# Patient Record
Sex: Male | Born: 1990 | Race: White | Hispanic: No | Marital: Single | Smoking: Current every day smoker
Health system: Southern US, Community
[De-identification: ages and names within clinical notes are randomized; demographics above are authoritative.]

---
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
## 2012-05-20 ENCOUNTER — Emergency Department (HOSPITAL_COMMUNITY): Payer: BC Managed Care – PPO

## 2012-05-20 ENCOUNTER — Encounter (HOSPITAL_COMMUNITY): Payer: Self-pay | Admitting: Anesthesiology

## 2012-05-20 ENCOUNTER — Observation Stay (HOSPITAL_COMMUNITY)
Admission: EM | Admit: 2012-05-20 | Discharge: 2012-05-20 | Disposition: A | Discharge status: Home / Self Care | Payer: BC Managed Care – PPO | Attending: Emergency Medicine | Admitting: Emergency Medicine

## 2012-05-20 ENCOUNTER — Encounter (HOSPITAL_COMMUNITY): Payer: Self-pay | Admitting: Emergency Medicine

## 2012-05-20 ENCOUNTER — Encounter (HOSPITAL_COMMUNITY)
Admission: EM | Disposition: A | Discharge status: Home / Self Care | Payer: Self-pay | Source: Home / Self Care | Attending: Emergency Medicine

## 2012-05-20 ENCOUNTER — Observation Stay (HOSPITAL_COMMUNITY): Payer: BC Managed Care – PPO | Admitting: Anesthesiology

## 2012-05-20 DIAGNOSIS — S02600B Fracture of unspecified part of body of mandible, initial encounter for open fracture: Secondary | ICD-10-CM

## 2012-05-20 DIAGNOSIS — S0266XA Fracture of symphysis of mandible, initial encounter for closed fracture: Secondary | ICD-10-CM | POA: Insufficient documentation

## 2012-05-20 DIAGNOSIS — Y92009 Unspecified place in unspecified non-institutional (private) residence as the place of occurrence of the external cause: Secondary | ICD-10-CM | POA: Insufficient documentation

## 2012-05-20 DIAGNOSIS — S0300XA Dislocation of jaw, unspecified side, initial encounter: Secondary | ICD-10-CM | POA: Insufficient documentation

## 2012-05-20 DIAGNOSIS — S025XXA Fracture of tooth (traumatic), initial encounter for closed fracture: Secondary | ICD-10-CM | POA: Insufficient documentation

## 2012-05-20 DIAGNOSIS — W1809XA Striking against other object with subsequent fall, initial encounter: Secondary | ICD-10-CM | POA: Insufficient documentation

## 2012-05-20 DIAGNOSIS — S02640A Fracture of ramus of mandible, unspecified side, initial encounter for closed fracture: Principal | ICD-10-CM | POA: Insufficient documentation

## 2012-05-20 DIAGNOSIS — R55 Syncope and collapse: Secondary | ICD-10-CM | POA: Insufficient documentation

## 2012-05-20 HISTORY — PX: CLOSED REDUCTION MANDIBLE: SHX5307

## 2012-05-20 LAB — PROTIME-INR: INR: 1.07 (ref 0.00–1.49)

## 2012-05-20 LAB — CBC WITH DIFFERENTIAL/PLATELET
Eosinophils Relative: 3 % (ref 0–5)
HCT: 44.6 % (ref 39.0–52.0)
Lymphocytes Relative: 27 % (ref 12–46)
Lymphs Abs: 2.5 10*3/uL (ref 0.7–4.0)
MCV: 88.3 fL (ref 78.0–100.0)
Monocytes Absolute: 0.7 10*3/uL (ref 0.1–1.0)
Neutro Abs: 5.8 10*3/uL (ref 1.7–7.7)
Platelets: 205 10*3/uL (ref 150–400)
RBC: 5.05 MIL/uL (ref 4.22–5.81)
WBC: 9.2 10*3/uL (ref 4.0–10.5)

## 2012-05-20 LAB — BASIC METABOLIC PANEL
CO2: 30 mEq/L (ref 19–32)
Calcium: 9.1 mg/dL (ref 8.4–10.5)
Chloride: 105 mEq/L (ref 96–112)
Glucose, Bld: 130 mg/dL — ABNORMAL HIGH (ref 70–99)
Sodium: 143 mEq/L (ref 135–145)

## 2012-05-20 SURGERY — CLOSED REDUCTION, MANDIBLE
Anesthesia: General | Wound class: Clean

## 2012-05-20 SURGERY — CANCELLED PROCEDURE

## 2012-05-20 MED ORDER — DEXAMETHASONE SODIUM PHOSPHATE 4 MG/ML IJ SOLN
INTRAMUSCULAR | Status: DC | PRN
  Administered 2012-05-20: 8 mg via INTRAVENOUS

## 2012-05-20 MED ORDER — LACTATED RINGERS IV SOLN
INTRAVENOUS | Status: DC | PRN
  Administered 2012-05-20 (×2): via INTRAVENOUS

## 2012-05-20 MED ORDER — MORPHINE SULFATE 4 MG/ML IJ SOLN: 4.0000 mg | Freq: Once | INTRAMUSCULAR | Status: DC

## 2012-05-20 MED ORDER — HYDROMORPHONE HCL PF 1 MG/ML IJ SOLN
INTRAMUSCULAR | Status: AC
  Filled 2012-05-20: qty 1

## 2012-05-20 MED ORDER — ONDANSETRON HCL 4 MG/2ML IJ SOLN
INTRAMUSCULAR | Status: DC | PRN
  Administered 2012-05-20: 4 mg via INTRAVENOUS

## 2012-05-20 MED ORDER — CEFAZOLIN SODIUM-DEXTROSE 2-3 GM-% IV SOLR
INTRAVENOUS | Status: DC | PRN
  Administered 2012-05-20: 2 g via INTRAVENOUS

## 2012-05-20 MED ORDER — SODIUM CHLORIDE 0.9 % IV BOLUS (SEPSIS)
1000.0000 mL | Freq: Once | INTRAVENOUS | Status: AC
  Administered 2012-05-20: 1000 mL via INTRAVENOUS

## 2012-05-20 MED ORDER — HYDROMORPHONE HCL PF 1 MG/ML IJ SOLN: 0.2500 mg | INTRAMUSCULAR | Status: DC | PRN

## 2012-05-20 MED ORDER — HYDROMORPHONE HCL PF 1 MG/ML IJ SOLN
0.2500 mg | INTRAMUSCULAR | Status: DC | PRN
  Administered 2012-05-20 (×4): 0.5 mg via INTRAVENOUS

## 2012-05-20 MED ORDER — HYDROMORPHONE HCL PF 1 MG/ML IJ SOLN: 1.0000 mg | INTRAMUSCULAR | Status: DC | PRN

## 2012-05-20 MED ORDER — HYDROCODONE-ACETAMINOPHEN 7.5-500 MG/15ML PO SOLN: 10.0000 mL | Freq: Four times a day (QID) | ORAL | Status: AC | PRN

## 2012-05-20 MED ORDER — SUCCINYLCHOLINE CHLORIDE 20 MG/ML IJ SOLN
INTRAMUSCULAR | Status: DC | PRN
  Administered 2012-05-20: 100 mg via INTRAVENOUS

## 2012-05-20 MED ORDER — HYDROMORPHONE HCL PF 1 MG/ML IJ SOLN
1.0000 mg | Freq: Once | INTRAMUSCULAR | Status: AC
  Administered 2012-05-20: 1 mg via INTRAVENOUS
  Filled 2012-05-20: qty 1

## 2012-05-20 MED ORDER — ONDANSETRON HCL 4 MG/2ML IJ SOLN
4.0000 mg | Freq: Once | INTRAMUSCULAR | Status: AC
  Administered 2012-05-20: 4 mg via INTRAVENOUS
  Filled 2012-05-20: qty 2

## 2012-05-20 MED ORDER — LACTATED RINGERS IV SOLN: INTRAVENOUS | Status: DC | PRN

## 2012-05-20 MED ORDER — MORPHINE SULFATE 4 MG/ML IJ SOLN
4.0000 mg | Freq: Once | INTRAMUSCULAR | Status: AC
  Administered 2012-05-20: 4 mg via INTRAVENOUS
  Filled 2012-05-20: qty 1

## 2012-05-20 MED ORDER — LIDOCAINE HCL (CARDIAC) 20 MG/ML IV SOLN
INTRAVENOUS | Status: DC | PRN
  Administered 2012-05-20: 100 mg via INTRAVENOUS

## 2012-05-20 MED ORDER — OXYMETAZOLINE HCL 0.05 % NA SOLN
NASAL | Status: DC | PRN
  Administered 2012-05-20: 2 via NASAL

## 2012-05-20 MED ORDER — SODIUM CHLORIDE 0.9 % IV SOLN
INTRAVENOUS | Status: DC | PRN
  Administered 2012-05-20: 14:00:00 via INTRAVENOUS

## 2012-05-20 MED ORDER — BACIT-POLY-NEO HC 1 % EX OINT
TOPICAL_OINTMENT | CUTANEOUS | Status: DC | PRN
  Administered 2012-05-20: 1

## 2012-05-20 MED ORDER — FENTANYL CITRATE 0.05 MG/ML IJ SOLN
INTRAMUSCULAR | Status: DC | PRN
  Administered 2012-05-20 (×2): 75 ug via INTRAVENOUS
  Administered 2012-05-20: 50 ug via INTRAVENOUS
  Administered 2012-05-20: 100 ug via INTRAVENOUS

## 2012-05-20 MED ORDER — SODIUM CHLORIDE 0.9 % IV SOLN
3.0000 g | Freq: Once | INTRAVENOUS | Status: AC
  Administered 2012-05-20: 3 g via INTRAVENOUS
  Filled 2012-05-20: qty 3

## 2012-05-20 MED ORDER — PROPOFOL 10 MG/ML IV BOLUS
INTRAVENOUS | Status: DC | PRN
  Administered 2012-05-20: 50 mg via INTRAVENOUS
  Administered 2012-05-20: 150 mg via INTRAVENOUS

## 2012-05-20 SURGICAL SUPPLY — 56 items
ATTRACTOMAT 16X20 MAGNETIC DRP (DRAPES) IMPLANT
BLADE SURG 15 STRL LF DISP TIS (BLADE) IMPLANT
BLADE SURG 15 STRL SS (BLADE)
BUR FAST CUTTING (BURR)
BUR SRGRND 54.5X2.4X8 (BURR) IMPLANT
BURR SRGRND 54.5X2.4X8 (BURR)
CANISTER SUCTION 2500CC (MISCELLANEOUS) ×3 IMPLANT
CLEANER TIP ELECTROSURG 2X2 (MISCELLANEOUS) ×3 IMPLANT
CLOTH BEACON ORANGE TIMEOUT ST (SAFETY) ×3 IMPLANT
COVER SURGICAL LIGHT HANDLE (MISCELLANEOUS) ×3 IMPLANT
DECANTER SPIKE VIAL GLASS SM (MISCELLANEOUS) ×3 IMPLANT
DRESSING TELFA 8X10 (GAUZE/BANDAGES/DRESSINGS) ×3 IMPLANT
ELECT REM PT RETURN 9FT ADLT (ELECTROSURGICAL) ×3
ELECTRODE REM PT RTRN 9FT ADLT (ELECTROSURGICAL) ×2 IMPLANT
GAUZE PACKING FOLDED 2  STR (GAUZE/BANDAGES/DRESSINGS) ×1
GAUZE PACKING FOLDED 2 STR (GAUZE/BANDAGES/DRESSINGS) ×2 IMPLANT
GAUZE SPONGE 4X4 16PLY XRAY LF (GAUZE/BANDAGES/DRESSINGS) ×3 IMPLANT
GLOVE BIO SURGEON STRL SZ 6.5 (GLOVE) IMPLANT
GLOVE BIO SURGEON STRL SZ7.5 (GLOVE) IMPLANT
GLOVE BIOGEL PI IND STRL 8 (GLOVE) ×2 IMPLANT
GLOVE BIOGEL PI INDICATOR 8 (GLOVE) ×1
GLOVE ECLIPSE 7.5 STRL STRAW (GLOVE) ×6 IMPLANT
GOWN STRL NON-REIN LRG LVL3 (GOWN DISPOSABLE) ×6 IMPLANT
KIT BASIN OR (CUSTOM PROCEDURE TRAY) ×3 IMPLANT
KIT ROOM TURNOVER OR (KITS) ×3 IMPLANT
MARKER SKIN DUAL TIP RULER LAB (MISCELLANEOUS) ×3 IMPLANT
NEEDLE DENTAL 27 LONG (NEEDLE) ×3 IMPLANT
NS IRRIG 1000ML POUR BTL (IV SOLUTION) ×3 IMPLANT
PACK EENT II TURBAN DRAPE (CUSTOM PROCEDURE TRAY) ×3 IMPLANT
PAD ARMBOARD 7.5X6 YLW CONV (MISCELLANEOUS) ×6 IMPLANT
PENCIL BUTTON HOLSTER BLD 10FT (ELECTRODE) ×3 IMPLANT
SCISSORS IRIS STR 4 1/2 STRL (INSTRUMENTS) IMPLANT
SCISSORS WIRE ANG 4 3/4 DISP (INSTRUMENTS) ×3 IMPLANT
SPONGE GAUZE 4X4 12PLY (GAUZE/BANDAGES/DRESSINGS) ×3 IMPLANT
STRIP CLOSURE SKIN 1/2X4 (GAUZE/BANDAGES/DRESSINGS) IMPLANT
SUT CHROMIC 3 0 PS 2 (SUTURE) IMPLANT
SUT CHROMIC 3 0 SH 27 (SUTURE) IMPLANT
SUT CHROMIC 4 0 PS 2 18 (SUTURE) IMPLANT
SUT ETHILON 5 0 P 3 18 (SUTURE)
SUT NYLON ETHILON 5-0 P-3 1X18 (SUTURE) IMPLANT
SUT PROLENE 5 0 PS 2 (SUTURE) ×3 IMPLANT
SUT SILK 2 0 FS (SUTURE) IMPLANT
SUT STEEL 0 (SUTURE)
SUT STEEL 0 18XMFL TIE 17 (SUTURE) IMPLANT
SUT STEEL 2 (SUTURE) ×9 IMPLANT
SUT STEEL 4 (SUTURE) IMPLANT
SUT VIC AB 4-0 PS2 27 (SUTURE) ×3 IMPLANT
SYR 50ML SLIP (SYRINGE) IMPLANT
TAPE PAPER 2X10 WHT MICROPORE (GAUZE/BANDAGES/DRESSINGS) ×3 IMPLANT
TOOTHBRUSH ADULT (PERSONAL CARE ITEMS) IMPLANT
TOWEL OR 17X24 6PK STRL BLUE (TOWEL DISPOSABLE) ×3 IMPLANT
TOWEL OR 17X26 10 PK STRL BLUE (TOWEL DISPOSABLE) ×3 IMPLANT
TRAY ENT MC OR (CUSTOM PROCEDURE TRAY) ×3 IMPLANT
TUBE CONNECTING 12X1/4 (SUCTIONS) ×3 IMPLANT
WATER STERILE IRR 1000ML POUR (IV SOLUTION) IMPLANT
YANKAUER SUCT BULB TIP NO VENT (SUCTIONS) IMPLANT

## 2012-05-20 SURGICAL SUPPLY — 36 items
BLADE SURG 15 STRL LF DISP TIS (BLADE) ×2 IMPLANT
BLADE SURG 15 STRL SS (BLADE) ×3
BLADE SURG ROTATE 9660 (MISCELLANEOUS) IMPLANT
CANISTER SUCTION 2500CC (MISCELLANEOUS) ×3 IMPLANT
CLEANER TIP ELECTROSURG 2X2 (MISCELLANEOUS) ×3 IMPLANT
CLOTH BEACON ORANGE TIMEOUT ST (SAFETY) ×3 IMPLANT
COVER SURGICAL LIGHT HANDLE (MISCELLANEOUS) ×3 IMPLANT
DECANTER SPIKE VIAL GLASS SM (MISCELLANEOUS) ×3 IMPLANT
DRSG NASOPORE 8CM (GAUZE/BANDAGES/DRESSINGS) ×3 IMPLANT
ELECT COATED BLADE 2.86 ST (ELECTRODE) ×3 IMPLANT
ELECT REM PT RETURN 9FT ADLT (ELECTROSURGICAL) ×3
ELECTRODE REM PT RTRN 9FT ADLT (ELECTROSURGICAL) ×2 IMPLANT
GLOVE SURG SS PI 7.5 STRL IVOR (GLOVE) ×3 IMPLANT
GOWN STRL NON-REIN LRG LVL3 (GOWN DISPOSABLE) ×6 IMPLANT
KIT BASIN OR (CUSTOM PROCEDURE TRAY) ×3 IMPLANT
KIT ROOM TURNOVER OR (KITS) ×3 IMPLANT
NEEDLE 27GAX1X1/2 (NEEDLE) ×3 IMPLANT
NS IRRIG 1000ML POUR BTL (IV SOLUTION) ×3 IMPLANT
PAD ARMBOARD 7.5X6 YLW CONV (MISCELLANEOUS) ×6 IMPLANT
PENCIL BUTTON HOLSTER BLD 10FT (ELECTRODE) ×3 IMPLANT
SCISSORS WIRE ANG 4 3/4 DISP (INSTRUMENTS) ×3 IMPLANT
SUT ETHILON 4 0 CL P 3 (SUTURE) IMPLANT
SUT MON AB 3-0 SH 27 (SUTURE) ×6
SUT MON AB 3-0 SH27 (SUTURE) ×4 IMPLANT
SUT PROLENE 6 0 PC 1 (SUTURE) IMPLANT
SUT STEEL 0 (SUTURE)
SUT STEEL 0 18XMFL TIE 17 (SUTURE) IMPLANT
SUT STEEL 1 (SUTURE) IMPLANT
SUT STEEL 2 (SUTURE) IMPLANT
SUT STEEL 4 (SUTURE) IMPLANT
SUT VICRYL 4-0 PS2 18IN ABS (SUTURE) IMPLANT
TOWEL OR 17X24 6PK STRL BLUE (TOWEL DISPOSABLE) ×3 IMPLANT
TOWEL OR 17X26 10 PK STRL BLUE (TOWEL DISPOSABLE) ×3 IMPLANT
TRAY ENT MC OR (CUSTOM PROCEDURE TRAY) ×3 IMPLANT
TRAY FOLEY CATH 14FRSI W/METER (CATHETERS) IMPLANT
WATER STERILE IRR 1000ML POUR (IV SOLUTION) ×3 IMPLANT

## 2012-05-20 NOTE — Anesthesia Preprocedure Evaluation (Addendum)
Anesthesia Evaluation  Patient identified by MRN, date of birth, ID band Patient awake    Reviewed: Allergy & Precautions, H&P , NPO status , Patient's Chart, lab work & pertinent test results  Airway Mallampati: IV TM Distance: <3 FB Neck ROM: Full  Mouth opening: Limited Mouth Opening  Dental  (+) Chipped   Pulmonary          Cardiovascular     Neuro/Psych    GI/Hepatic (+)     substance abuse  alcohol use,   Endo/Other    Renal/GU      Musculoskeletal   Abdominal   Peds  Hematology   Anesthesia Other Findings   Reproductive/Obstetrics                           Anesthesia Physical Anesthesia Plan  ASA: II  Anesthesia Plan: General   Post-op Pain Management:    Induction: Intravenous  Airway Management Planned: Nasal ETT  Additional Equipment:   Intra-op Plan:   Post-operative Plan: Extubation in OR  Informed Consent: I have reviewed the patients History and Physical, chart, labs and discussed the procedure including the risks, benefits and alternatives for the proposed anesthesia with the patient or authorized representative who has indicated his/her understanding and acceptance.   Dental advisory given  Plan Discussed with: CRNA and Anesthesiologist  Anesthesia Plan Comments:         Anesthesia Quick Evaluation

## 2012-05-20 NOTE — Consult Note (Signed)
  This is a 22 y/o wd/wn white male who alledged fell early this morning while in the bathroom.  He struck his chin and was unable to close his mouth after.  Clinically he has an anterior open bite with mobilitly in the right and left mandible.  The maxilla is intact.  There is a fractured tooth on the posterior lower right which may need extraction.   The treatment plan is to do an open/closed reduction with Erich arch bars and IMF.  There is a 4 cm laceration down to bone in the submental area. This will also be closed at the same time as the fractures

## 2012-05-20 NOTE — ED Notes (Signed)
Patient taken to the OR holding report given to [facility].

## 2012-05-20 NOTE — ED Provider Notes (Addendum)
History     CSN: [id number]  Arrival date & time 05/20/12  0712   First MD Initiated Contact with Patient 05/20/12 0732      Chief Complaint  Patient presents with  . Fall  . Facial Laceration    (Consider location/radiation/quality/duration/timing/severity/associated sxs/prior treatment) HPI Comments: Patient states he had a syncopal episode this morning while standing in the bathroom. He felt lightheaded, dizzy and nauseated and fell striking his chin on a countertop. He has diffuse jaw pain is unable to close his mouth completely. He has a chin laceration and bleeding from his lower gingiva. He has difficulty swallowing but is controlling his secretions. Denies any nausea, vomiting. Denies any vision change. No chest pain, SOB, abdominal pain.  Admits to drinking alcohol last night.  The history is provided by the patient and a parent.    History reviewed. No pertinent past medical history.  History reviewed. No pertinent past surgical history.  No family history on file.  History  Substance Use Topics  . Smoking status: Current Every Day Smoker  . Smokeless tobacco: Not on file  . Alcohol Use: Yes      Review of Systems  Constitutional: Negative for fever, activity change and appetite change.  HENT: Positive for facial swelling, drooling, trouble swallowing, dental problem and voice change. Negative for congestion, rhinorrhea and neck pain.   Eyes: Negative for photophobia.  Respiratory: Negative for cough, chest tightness and shortness of breath.   Cardiovascular: Negative for chest pain.  Gastrointestinal: Negative for nausea, vomiting and abdominal pain.  Genitourinary: Negative for dysuria.  Musculoskeletal: Negative for myalgias, back pain and arthralgias.  Skin: Positive for wound. Negative for rash.  Neurological: Positive for dizziness, syncope and light-headedness. Negative for weakness, numbness and headaches.  A complete 10 system review of systems was  obtained and all systems are negative except as noted in the HPI and PMH.    Allergies  Review of patient's allergies indicates no known allergies.  Home Medications  No current outpatient prescriptions on file.  BP 123/72  Pulse 57  Temp(Src) 97.6 F (36.4 C) (Axillary)  SpO2 99%  Physical Exam  Constitutional: He is oriented to person, place, and time. He appears well-developed and well-nourished. No distress.  HENT:  Head: Normocephalic and atraumatic.  Mouth/Throat: Oropharynx is clear and moist. No oropharyngeal exudate.  3 cm laceration to chin along mandible. Bleeding from lower gingiva. + malocclusion and trismus. Controlling secretions  Eyes: Conjunctivae and EOM are normal. Pupils are equal, round, and reactive to light.  Neck: Normal range of motion. Neck supple.  No C spine pain, stepoff, deformity  Cardiovascular: Normal rate, regular rhythm and normal heart sounds.   No murmur heard. Pulmonary/Chest: Effort normal and breath sounds normal. No respiratory distress.  Abdominal: Soft. There is no tenderness. There is no rebound and no guarding.  Musculoskeletal: Normal range of motion. He exhibits no edema and no tenderness.  Neurological: He is alert and oriented to person, place, and time. No cranial nerve deficit. He exhibits normal muscle tone. Coordination normal.  Moving all extremities, 5/5 strength throughout.  Skin: Skin is warm.    ED Course  Procedures (including critical care time)  Labs Reviewed  CBC WITH DIFFERENTIAL  PROTIME-INR  BASIC METABOLIC PANEL   Dg Chest 2 View  05/20/2012  *RADIOLOGY REPORT*  Clinical Data: Syncope.  Fall.  CHEST - 2 VIEW  Comparison: None.  Findings: Cardiac and mediastinal contours appear normal.  The lungs appear clear.  No pleural effusion is identified.  IMPRESSION:  No significant abnormality identified.   Original Report Authenticated By: Gaylyn Rong, M.D.    Ct Head Wo Contrast  05/20/2012  *RADIOLOGY  REPORT*  Clinical Data:  [name], laceration  CT HEAD WITHOUT CONTRAST CT MAXILLOFACIAL WITHOUT CONTRAST CT CERVICAL SPINE WITHOUT CONTRAST  Technique:  Multidetector CT imaging of the head, cervical spine, and maxillofacial structures were performed using the standard protocol without intravenous contrast. Multiplanar CT image reconstructions of the cervical spine and maxillofacial structures were also generated.  Comparison:   None  CT HEAD  Findings: There is no evidence of acute intracranial hemorrhage, brain edema, mass lesion, acute infarction,   mass effect, or midline shift. Acute infarct may be inapparent on noncontrast CT. No other intra-axial abnormalities are seen, and the ventricles and sulci are within normal limits in size and symmetry.   No abnormal extra-axial fluid collections or masses are identified.  No significant calvarial abnormality.  IMPRESSION: 1. Negative for bleed or other acute intracranial process.  CT MAXILLOFACIAL  Findings:  Paranasal sinuses are normally developed and well aerated.  Nasal septum midline.  Orbits and globes intact.  There is a fracture through the mandibular symphysis, extending to involve tooth number 24.  There are bilateral mandibular ramus fractures, with significant angulation on the right and greater than shaft with displacement and override on the left.  There is anterior subluxation of the right temporomandibular joint.  IMPRESSION:  1.  Fractures of the bilateral mandibular ramus and symphysis as detailed above.  CT CERVICAL SPINE  Findings:   Normal alignment.  Vertebral body and intervertebral disc height maintained throughout.  Facets seated.  No prevertebral soft tissue swelling.  Negative for fracture.  No significant osseous degenerative change.  IMPRESSION:  1.  Negative   Original Report Authenticated By: D. Andria Rhein, MD    Ct Cervical Spine Wo Contrast  05/20/2012  *RADIOLOGY REPORT*  Clinical Data:  [name], laceration  CT HEAD WITHOUT CONTRAST CT  MAXILLOFACIAL WITHOUT CONTRAST CT CERVICAL SPINE WITHOUT CONTRAST  Technique:  Multidetector CT imaging of the head, cervical spine, and maxillofacial structures were performed using the standard protocol without intravenous contrast. Multiplanar CT image reconstructions of the cervical spine and maxillofacial structures were also generated.  Comparison:   None  CT HEAD  Findings: There is no evidence of acute intracranial hemorrhage, brain edema, mass lesion, acute infarction,   mass effect, or midline shift. Acute infarct may be inapparent on noncontrast CT. No other intra-axial abnormalities are seen, and the ventricles and sulci are within normal limits in size and symmetry.   No abnormal extra-axial fluid collections or masses are identified.  No significant calvarial abnormality.  IMPRESSION: 1. Negative for bleed or other acute intracranial process.  CT MAXILLOFACIAL  Findings:  Paranasal sinuses are normally developed and well aerated.  Nasal septum midline.  Orbits and globes intact.  There is a fracture through the mandibular symphysis, extending to involve tooth number 24.  There are bilateral mandibular ramus fractures, with significant angulation on the right and greater than shaft with displacement and override on the left.  There is anterior subluxation of the right temporomandibular joint.  IMPRESSION:  1.  Fractures of the bilateral mandibular ramus and symphysis as detailed above.  CT CERVICAL SPINE  Findings:   Normal alignment.  Vertebral body and intervertebral disc height maintained throughout.  Facets seated.  No prevertebral soft tissue swelling.  Negative for fracture.  No significant osseous degenerative change.  IMPRESSION:  1.  Negative   Original Report Authenticated By: D. Andria Rhein, MD    Ct Maxillofacial Wo Cm  05/20/2012  *RADIOLOGY REPORT*  Clinical Data:  [name], laceration  CT HEAD WITHOUT CONTRAST CT MAXILLOFACIAL WITHOUT CONTRAST CT CERVICAL SPINE WITHOUT CONTRAST  Technique:   Multidetector CT imaging of the head, cervical spine, and maxillofacial structures were performed using the standard protocol without intravenous contrast. Multiplanar CT image reconstructions of the cervical spine and maxillofacial structures were also generated.  Comparison:   None  CT HEAD  Findings: There is no evidence of acute intracranial hemorrhage, brain edema, mass lesion, acute infarction,   mass effect, or midline shift. Acute infarct may be inapparent on noncontrast CT. No other intra-axial abnormalities are seen, and the ventricles and sulci are within normal limits in size and symmetry.   No abnormal extra-axial fluid collections or masses are identified.  No significant calvarial abnormality.  IMPRESSION: 1. Negative for bleed or other acute intracranial process.  CT MAXILLOFACIAL  Findings:  Paranasal sinuses are normally developed and well aerated.  Nasal septum midline.  Orbits and globes intact.  There is a fracture through the mandibular symphysis, extending to involve tooth number 24.  There are bilateral mandibular ramus fractures, with significant angulation on the right and greater than shaft with displacement and override on the left.  There is anterior subluxation of the right temporomandibular joint.  IMPRESSION:  1.  Fractures of the bilateral mandibular ramus and symphysis as detailed above.  CT CERVICAL SPINE  Findings:   Normal alignment.  Vertebral body and intervertebral disc height maintained throughout.  Facets seated.  No prevertebral soft tissue swelling.  Negative for fracture.  No significant osseous degenerative change.  IMPRESSION:  1.  Negative   Original Report Authenticated By: D. Andria Rhein, MD      No diagnosis found.    MDM  Syncopal episode with chin laceration and probable jaw fracture.  +LOC. Now with jaw pain, trismus, malocclusion.  Multiple mandible fractures noted on CT. Discussed with Dr. Ellyn Hack nurse. He would like patient transferred to [facility] for  evaluation. Will start antibiotics for open fracture.  No other apparent tract injuries.  Suspect syncope to be vasovagal. Vitals are stable. Hemoglobin stable. No arrhythmias an EKG.   Date: 05/20/2012  Rate: 54  Rhythm: normal sinus rhythm  QRS Axis: normal  Intervals: normal  ST/T Wave abnormalities: normal  Conduction Disutrbances:none  Narrative Interpretation: no Brugada, no prolonged QT  Old EKG Reviewed: none available  Addendum 10:45 AM. Patient is family has spoken with Dr. Royston Sinner who is an oral surgeon and a friend of theirs. They prefer that he perform the patient surgery. Will still plan to transfer patient to [facility].   Glynn Octave, MD 05/20/12 6213  Glynn Octave, MD 05/20/12 1047

## 2012-05-20 NOTE — H&P (Signed)
Darren Case is an 22 y.o. male.   Chief Complaint: [name] struck face,  Can't close mouth [id number] this morning in bathroom.   Struck chin  History reviewed. No pertinent past medical history.  History reviewed. No pertinent past surgical history.  History reviewed. No pertinent family history. Social History:  reports that he has been smoking.  He does not have any smokeless tobacco history on file. He reports that  drinks alcohol. His drug history is not on file.  Allergies: No Known Allergies   (Not in a hospital admission)  Results for orders placed during the hospital encounter of 05/20/12 (from the past 48 hour(s))  CBC WITH DIFFERENTIAL     Status: None   Collection Time    05/20/12  7:55 AM      Result Value Range   WBC 9.2  4.0 - 10.5 K/uL   RBC 5.05  4.22 - 5.81 MIL/uL   Hemoglobin 15.8  13.0 - 17.0 g/dL   HCT 45.4  09.8 - 11.9 %   MCV 88.3  78.0 - 100.0 fL   MCH 31.3  26.0 - 34.0 pg   MCHC 35.4  30.0 - 36.0 g/dL   RDW 14.7  82.9 - 56.2 %   Platelets 205  150 - 400 K/uL   Neutrophils Relative 62  43 - 77 %   Neutro Abs 5.8  1.7 - 7.7 K/uL   Lymphocytes Relative 27  12 - 46 %   Lymphs Abs 2.5  0.7 - 4.0 K/uL   Monocytes Relative 8  3 - 12 %   Monocytes Absolute 0.7  0.1 - 1.0 K/uL   Eosinophils Relative 3  0 - 5 %   Eosinophils Absolute 0.3  0.0 - 0.7 K/uL   Basophils Relative 0  0 - 1 %   Basophils Absolute 0.0  0.0 - 0.1 K/uL  BASIC METABOLIC PANEL     Status: Abnormal   Collection Time    05/20/12  7:55 AM      Result Value Range   Sodium 143  135 - 145 mEq/L   Potassium 3.8  3.5 - 5.1 mEq/L   Chloride 105  96 - 112 mEq/L   CO2 30  19 - 32 mEq/L   Glucose, Bld 130 (*) 70 - 99 mg/dL   BUN 14  6 - 23 mg/dL   Creatinine, Ser 1.30  0.50 - 1.35 mg/dL   Calcium 9.1  8.4 - 86.5 mg/dL   GFR calc non Af Amer >90  >90 mL/min   GFR calc Af Amer >90  >90 mL/min   Comment:            The eGFR has been calculated     using the CKD EPI equation.     This  calculation has not been     validated in all clinical     situations.     eGFR's persistently     <90 mL/min signify     possible Chronic Kidney Disease.  PROTIME-INR     Status: None   Collection Time    05/20/12  7:55 AM      Result Value Range   Prothrombin Time 13.8  11.6 - 15.2 seconds   INR 1.07  0.00 - 1.49   Dg Chest 2 View  05/20/2012  *RADIOLOGY REPORT*  Clinical Data: Syncope.  Fall.  CHEST - 2 VIEW  Comparison: None.  Findings: Cardiac and mediastinal contours appear normal.  The lungs appear clear.  No pleural effusion is identified.  IMPRESSION:  No significant abnormality identified.   Original Report Authenticated By: Gaylyn Rong, M.D.    Ct Head Wo Contrast  05/20/2012  *RADIOLOGY REPORT*  Clinical Data:  [name], laceration  CT HEAD WITHOUT CONTRAST CT MAXILLOFACIAL WITHOUT CONTRAST CT CERVICAL SPINE WITHOUT CONTRAST  Technique:  Multidetector CT imaging of the head, cervical spine, and maxillofacial structures were performed using the standard protocol without intravenous contrast. Multiplanar CT image reconstructions of the cervical spine and maxillofacial structures were also generated.  Comparison:   None  CT HEAD  Findings: There is no evidence of acute intracranial hemorrhage, brain edema, mass lesion, acute infarction,   mass effect, or midline shift. Acute infarct may be inapparent on noncontrast CT. No other intra-axial abnormalities are seen, and the ventricles and sulci are within normal limits in size and symmetry.   No abnormal extra-axial fluid collections or masses are identified.  No significant calvarial abnormality.  IMPRESSION: 1. Negative for bleed or other acute intracranial process.  CT MAXILLOFACIAL  Findings:  Paranasal sinuses are normally developed and well aerated.  Nasal septum midline.  Orbits and globes intact.  There is a fracture through the mandibular symphysis, extending to involve tooth number 24.  There are bilateral mandibular ramus fractures,  with significant angulation on the right and greater than shaft with displacement and override on the left.  There is anterior subluxation of the right temporomandibular joint.  IMPRESSION:  1.  Fractures of the bilateral mandibular ramus and symphysis as detailed above.  CT CERVICAL SPINE  Findings:   Normal alignment.  Vertebral body and intervertebral disc height maintained throughout.  Facets seated.  No prevertebral soft tissue swelling.  Negative for fracture.  No significant osseous degenerative change.  IMPRESSION:  1.  Negative   Original Report Authenticated By: D. Andria Rhein, MD    Ct Cervical Spine Wo Contrast  05/20/2012  *RADIOLOGY REPORT*  Clinical Data:  [name], laceration  CT HEAD WITHOUT CONTRAST CT MAXILLOFACIAL WITHOUT CONTRAST CT CERVICAL SPINE WITHOUT CONTRAST  Technique:  Multidetector CT imaging of the head, cervical spine, and maxillofacial structures were performed using the standard protocol without intravenous contrast. Multiplanar CT image reconstructions of the cervical spine and maxillofacial structures were also generated.  Comparison:   None  CT HEAD  Findings: There is no evidence of acute intracranial hemorrhage, brain edema, mass lesion, acute infarction,   mass effect, or midline shift. Acute infarct may be inapparent on noncontrast CT. No other intra-axial abnormalities are seen, and the ventricles and sulci are within normal limits in size and symmetry.   No abnormal extra-axial fluid collections or masses are identified.  No significant calvarial abnormality.  IMPRESSION: 1. Negative for bleed or other acute intracranial process.  CT MAXILLOFACIAL  Findings:  Paranasal sinuses are normally developed and well aerated.  Nasal septum midline.  Orbits and globes intact.  There is a fracture through the mandibular symphysis, extending to involve tooth number 24.  There are bilateral mandibular ramus fractures, with significant angulation on the right and greater than shaft with  displacement and override on the left.  There is anterior subluxation of the right temporomandibular joint.  IMPRESSION:  1.  Fractures of the bilateral mandibular ramus and symphysis as detailed above.  CT CERVICAL SPINE  Findings:   Normal alignment.  Vertebral body and intervertebral disc height maintained throughout.  Facets seated.  No prevertebral soft tissue swelling.  Negative for fracture.  No significant osseous degenerative change.  IMPRESSION:  1.  Negative   Original Report Authenticated By: D. Andria Rhein, MD    Ct Maxillofacial Wo Cm  05/20/2012  *RADIOLOGY REPORT*  Clinical Data:  [name], laceration  CT HEAD WITHOUT CONTRAST CT MAXILLOFACIAL WITHOUT CONTRAST CT CERVICAL SPINE WITHOUT CONTRAST  Technique:  Multidetector CT imaging of the head, cervical spine, and maxillofacial structures were performed using the standard protocol without intravenous contrast. Multiplanar CT image reconstructions of the cervical spine and maxillofacial structures were also generated.  Comparison:   None  CT HEAD  Findings: There is no evidence of acute intracranial hemorrhage, brain edema, mass lesion, acute infarction,   mass effect, or midline shift. Acute infarct may be inapparent on noncontrast CT. No other intra-axial abnormalities are seen, and the ventricles and sulci are within normal limits in size and symmetry.   No abnormal extra-axial fluid collections or masses are identified.  No significant calvarial abnormality.  IMPRESSION: 1. Negative for bleed or other acute intracranial process.  CT MAXILLOFACIAL  Findings:  Paranasal sinuses are normally developed and well aerated.  Nasal septum midline.  Orbits and globes intact.  There is a fracture through the mandibular symphysis, extending to involve tooth number 24.  There are bilateral mandibular ramus fractures, with significant angulation on the right and greater than shaft with displacement and override on the left.  There is anterior subluxation of the  right temporomandibular joint.  IMPRESSION:  1.  Fractures of the bilateral mandibular ramus and symphysis as detailed above.  CT CERVICAL SPINE  Findings:   Normal alignment.  Vertebral body and intervertebral disc height maintained throughout.  Facets seated.  No prevertebral soft tissue swelling.  Negative for fracture.  No significant osseous degenerative change.  IMPRESSION:  1.  Negative   Original Report Authenticated By: D. Andria Rhein, MD     Review of Systems  Constitutional: Negative.   HENT: Negative.   Eyes: Negative.   Respiratory: Negative.   Cardiovascular: Negative.   Gastrointestinal: Negative.   Genitourinary: Negative.   Musculoskeletal: Negative.   Skin: Negative for itching.  Neurological: Negative.   Endo/Heme/Allergies: Negative.   Psychiatric/Behavioral: Negative.     Blood pressure 125/95, pulse 62, temperature 98.1 F (36.7 C), temperature source Oral, resp. rate 15, SpO2 98.00%. Physical Exam  Constitutional: He is oriented to person, place, and time. He appears well-developed and well-nourished.  HENT:  Head: Normocephalic. Head is with laceration.    Right Ear: External ear normal.  Left Ear: External ear normal.  Mouth/Throat: Uvula is midline, oropharynx is clear and moist and mucous membranes are normal. Abnormal dentition. Lacerations present.    Eyes: Conjunctivae and EOM are normal. Pupils are equal, round, and reactive to light.  Neck: Normal range of motion. Neck supple. No tracheal deviation present.  Cardiovascular: Normal rate, regular rhythm, normal heart sounds and intact distal pulses.   Respiratory: Effort normal and breath sounds normal.  GI: Soft. Bowel sounds are normal.  Musculoskeletal: Normal range of motion.  Neurological: He is alert and oriented to person, place, and time. He has normal reflexes.  Skin: Skin is warm and dry.  Psychiatric: He has a normal mood and affect. His behavior is normal. Judgment and thought content  normal.     Assessment/Plan Open/close reduction of fractured mandible.  Possible plate placement.  Closure of laceration. Wiring of teeth  Darleen Moffitt,JOSEPH L 05/20/2012, 1:17 PM

## 2012-05-20 NOTE — Transfer of Care (Signed)
Immediate Anesthesia Transfer of Care Note  Patient: Darren Case  Procedure(s) Performed: Procedure(s): CLOSED REDUCTION MANDIBULAR FRACTURE WITH ARCH BARS; CLOSURE OF CHIN LACERATION (N/A)  Patient Location: PACU  Anesthesia Type:General  Level of Consciousness: awake  Airway & Oxygen Therapy: Patient Spontanous Breathing  Post-op Assessment: Report given to PACU RN and Post -op Vital signs reviewed and stable  Post vital signs: Reviewed and stable  Complications: No apparent anesthesia complications

## 2012-05-20 NOTE — Op Note (Signed)
And was brought to the operating room in the supine position and which she remained throughout the whole procedure. He was intubated via a left nasoendotracheal tube and prepped and draped in the usual fashion for an intraoral/extraoral procedure. 4 drapes were placed on the patient's face and the patient was draped off. The mouth and throat were suctioned out and an open moist 4 x 4 gauze was placed around the endotracheal tube. The maxillary occlusion was measured using a 24-gauge stainless steel wire on 2 hemostats extending it from #2 to #15. An Erich arch bar was then cut to fit the area. 24-gauge stainless steel wires were passed around teeth and tied around the [facility] arch bar. 24-gauge stainless steel loops were placed around #2, 3, 4, 5, 6, 7, 8, 9, 10, 11, 12, 13, 14, 15. The tails were rosetted down and turned clockwise into the mucobuccal fold. The arch bar was tested for stability. The 24-gauge stainless steel wire on 2 hemostats was then used to measure from tooth #18 to #31. It was noted that #30 and #30 one half fractures off the lingual cusps. This happened allegedly at the time of the injury. The teeth were not significantly damaged and would be easily repaired postoperatively. In [place] arch bar was then measured to fit the area. 24-gauge stainless steel wire was passed around the mandibular teeth and around the [facility] arch bar. 24-gauge stainless steel wires were passed around #18, 19, 20, 23, 24, 25, 26, 27, 28, 29, 30, and #31. Wires could not be passed around #21 and #22. The lingual arch wire placed by the orthodontist was partially off and totally removed. The the mandibular fracture was well aligned by manipulation and tightening down of the [facility] arch bar. The patient then had the mandible manipulated to pull it down in the back and advance it. It then went easily into a class I occlusion it appeared to be his normal occlusion. Prior to place him in occlusion the throat pack was removed.  24-gauge stainless steel IMF wires were placed around the Erich arch bars and held him in position. These wires were tightened down and rosetted off. The wires around the arch bar also rosetted off in a clockwise fashion. With the occlusion stable the patient was reprepped and draped for the extraoral laceration/exploration and possible plate placement. The laceration under his chin was opened up and prepped with Betadine scrub and then washed out. An incision was made down to periosteum and bleeding points were coagulated with an electrosurgical. The fracture was found to be extremely well lined up and nonmobile. Because of that I elected not to put a plate in at this time. The soft tissue was then irrigated and closed in multiple layers using 4-0 Vicryl sutures. The skin was closed with 5-0 Prolene sutures. Good closure was achieved. Neosporin was placed over the laceration and Telfa with and a pressure dressing was then placed over that. The patient was extubated on the table and returned to the recovery room in good condition. He will have postoperative x-rays in the office. He will be discharged to home with a prescription for Keflex elixir and Lortab elixir.

## 2012-05-20 NOTE — ED Notes (Signed)
Patient presents to the ED as a transfer via [facility] from [facility] with a bilateral mandibular fracture. He got up this morning and got lightheaded and fell striking his chin on the floor. He is alert and oriented and follows commands.He rates his pain a "5" on the pain scale. Lacation noted to the chin no active bleeding. Parents at the bedside.

## 2012-05-20 NOTE — ED Notes (Addendum)
Pt present with laceration under chin with bleeding controlled, bleeding inside mouth. Pt states he got lightheaded, passed out, and hit the floor this morning.

## 2012-05-20 NOTE — Anesthesia Postprocedure Evaluation (Signed)
  Anesthesia Post-op Note  Patient: Darren Case  Procedure(s) Performed: Procedure(s): CLOSED REDUCTION MANDIBULAR FRACTURE WITH ARCH BARS; CLOSURE OF CHIN LACERATION (N/A)  Patient Location: PACU  Anesthesia Type:General  Level of Consciousness: awake  Airway and Oxygen Therapy: Patient Spontanous Breathing  Post-op Pain: mild  Post-op Assessment: Post-op Vital signs reviewed  Post-op Vital Signs: Reviewed  Complications: No apparent anesthesia complications

## 2012-05-20 NOTE — Anesthesia Procedure Notes (Signed)
Procedure Name: Intubation Date/Time: 05/20/2012 2:52 PM Performed by: Alanda Amass A Pre-anesthesia Checklist: Patient identified, Timeout performed, Emergency Drugs available, Suction available and Patient being monitored Patient Re-evaluated:Patient Re-evaluated prior to inductionOxygen Delivery Method: Circle system utilized Preoxygenation: Pre-oxygenation with 100% oxygen Intubation Type: IV induction Ventilation: Mask ventilation without difficulty Grade View: Grade I Nasal Tubes: Right, Nasal Rae and Nasal prep performed Tube size: 7.0 mm Number of attempts: 1 Airway Equipment and Method: Video-laryngoscopy Placement Confirmation: ETT inserted through vocal cords under direct vision,  breath sounds checked- equal and bilateral and positive ETCO2 Secured at: 28 cm Tube secured with: Tape Dental Injury: Teeth and Oropharynx as per pre-operative assessment

## 2012-05-20 NOTE — ED Notes (Signed)
Patient transported to X-ray 

## 2012-05-20 NOTE — Brief Op Note (Signed)
05/20/2012  4:31 PM  PATIENT:  Darren Case  22 y.o. male  PRE-OPERATIVE DIAGNOSIS:  mandible fracture  POST-OPERATIVE DIAGNOSIS:  same  PROCEDURE:  Procedure(s): CLOSED REDUCTION MANDIBULAR FRACTURE WITH ARCH BARS; CLOSURE OF CHIN LACERATION (N/A)  SURGEON:  Surgeon(s) and Role:    * Hinton Dyer, DDS - Primary  PHYSICIAN ASSISTANT:   ASSISTANTS: none   ANESTHESIA:   general  EBL:  Total I/O In: 2000 [I.V.:2000] Out: -   BLOOD ADMINISTERED:none  DRAINS: none   LOCAL MEDICATIONS USED:  NONE  SPECIMEN:  No Specimen  DISPOSITION OF SPECIMEN:  N/A  COUNTS:  YES  TOURNIQUET:  * No tourniquets in log *  DICTATION: .Dragon Dictation  PLAN OF CARE: Discharge to home after PACU  PATIENT DISPOSITION:  PACU - hemodynamically stable.   Delay start of Pharmacological VTE agent (>24hrs) due to surgical blood loss or risk of bleeding: not applicable

## 2012-05-22 ENCOUNTER — Encounter (HOSPITAL_COMMUNITY): Payer: Self-pay | Admitting: Oral Surgery

## 2012-05-29 NOTE — Discharge Summary (Signed)
Patient discharged to home from [facility] in good condition.  Discharge dx:  Fractured mandible and laceration of face

## 2013-08-02 ENCOUNTER — Ambulatory Visit (INDEPENDENT_AMBULATORY_CARE_PROVIDER_SITE_OTHER): Payer: BC Managed Care – PPO | Admitting: Family Medicine

## 2013-08-02 VITALS — BP 112/68 | HR 55 | Temp 97.6°F | Resp 16 | Ht 71.25 in | Wt 146.2 lb

## 2013-08-02 DIAGNOSIS — Z7189 Other specified counseling: Secondary | ICD-10-CM

## 2013-08-02 DIAGNOSIS — Z7184 Encounter for health counseling related to travel: Secondary | ICD-10-CM

## 2013-08-02 DIAGNOSIS — Z0000 Encounter for general adult medical examination without abnormal findings: Secondary | ICD-10-CM

## 2013-08-02 MED ORDER — TYPHOID VACCINE PO CPDR: 1.0000 | DELAYED_RELEASE_CAPSULE | ORAL | Status: AC

## 2013-08-02 NOTE — Progress Notes (Signed)
Subjective:    Patient ID: Darren Case, male    DOB: 1990-08-06, 23 y.o.   MRN: [id number] Chief Complaint  Patient presents with  . Annual Exam     HPI  Darren Case is a [employer] student who is going on a study abroad trip to [place] in the fall - leaving at the end of August. He does speak a little Cong[place]inese - has been taking classes and was there once before when he worked in a [employer]. Has no idea if he will need any immunizations for this trip.  Does need a form filled out stating he is ok to go as he will not have any access to [place] trained medical providers while there. Does not want to get any lab today as unsure about his current insurance coverage as he was prev covered by [employer] then has to get new health ins in the fall. Has not had any labs prior and no recent STD screening but has access to free screening at student health. Declines both today.  History reviewed. No pertinent past medical history. Past Surgical History  Procedure Laterality Date  . Closed reduction mandible N/A 05/20/2012    Procedure: CLOSED REDUCTION MANDIBULAR FRACTURE WITH ARCH BARS; CLOSURE OF CHIN LACERATION;  Surgeon: Hinton DyerJoseph L Miller, DDS;  Location: [facility] OR;  Service: Oral Surgery;  Laterality: N/A;   Current Outpatient Prescriptions on File Prior to Visit  Medication Sig Dispense Refill  . HYDROcodone-acetaminophen (LORTAB) 7.5-500 MG/15ML solution Take 10 mLs by mouth every 6 (six) hours as needed for pain.  120 mL  0   No current facility-administered medications on file prior to visit.   No Known Allergies History reviewed. No pertinent family history. History   Social History  . Marital Status: Single    Spouse Name: N/A    Number of Children: N/A  . Years of Education: N/A   Social History Main Topics  . Smoking status: Current Every Day Smoker  . Smokeless tobacco: None  . Alcohol Use: Yes  . Drug Use: None  . Sexual Activity: None   Other Topics Concern  . None    Social History Narrative  . None      Review of Systems  HENT: Negative for dental problem.   Gastrointestinal: Negative for vomiting, abdominal pain, diarrhea and constipation.  Genitourinary: Negative for dysuria, discharge, penile swelling, difficulty urinating, penile pain and testicular pain.  Skin: Negative for rash.  All other systems reviewed and are negative.     BP 112/68  Pulse 55  Temp(Src) 97.6 F (36.4 C) (Oral)  Resp 16  Ht 5' 11.25" (1.81 m)  Wt 146 lb 3.2 oz (66.316 kg)  BMI 20.24 kg/m2  SpO2 99% Objective:   Physical Exam  Constitutional: He is oriented to person, place, and time. He appears well-developed and well-nourished. No distress.  HENT:  Head: Normocephalic and atraumatic.  Right Ear: Tympanic membrane, external ear and ear canal normal.  Left Ear: Tympanic membrane, external ear and ear canal normal.  Nose: Nose normal.  Mouth/Throat: Uvula is midline, oropharynx is clear and moist and mucous membranes are normal. No oropharyngeal exudate.  Eyes: Conjunctivae are normal. Right eye exhibits no discharge. Left eye exhibits no discharge. No scleral icterus.  Neck: Normal range of motion. Neck supple. No thyromegaly present.  Cardiovascular: Normal rate, regular rhythm, normal heart sounds and intact distal pulses.   Pulmonary/Chest: Effort normal and breath sounds normal. No respiratory distress.  Abdominal:  Soft. Bowel sounds are normal. He exhibits no distension and no mass. There is no tenderness. There is no rebound and no guarding.  Musculoskeletal: He exhibits no edema.  Lymphadenopathy:    He has no cervical adenopathy.  Neurological: He is alert and oriented to person, place, and time. He has normal reflexes. No cranial nerve deficit. He exhibits normal muscle tone.  Skin: Skin is warm and dry. No rash noted. He is not diaphoretic. No erythema.  Psychiatric: He has a normal mood and affect. His behavior is normal.           Assessment & Plan:   Routine general medical examination at a health care facility  Travel advice encounter If he needs rx for malaria prophylaxis, ok to call in for this.  CDC immunization recs for [place] reviewed w/ pt - should prob get typhoid vaccine and he thinks he has had hep A and B. Thinks he got Td and meningococcal in middle school and UTD on those. Does not think he needs yellow fever, japanese encephalitis or malaria proph but he will check with the group leader. Meds ordered this encounter  Medications  . typhoid (VIVOTIF) DR capsule    Sig: Take 1 capsule by mouth every other day.    Dispense:  4 capsule    Refill:  0    Norberto SorensonEva Shaw, MD MPH

## 2013-08-02 NOTE — Patient Instructions (Signed)
Keeping you healthy  Get these tests  Blood pressure- Have your blood pressure checked once a year by your healthcare provider.  Normal blood pressure is 120/80.  Weight- Have your body mass index (BMI) calculated to screen for obesity.  BMI is a measure of body fat based on height and weight. You can also calculate your own BMI at [email or web address].west-[email or web address]/www.[email or web address]/.  Cholesterol- Have your cholesterol checked regularly starting at age 21, sooner may be necessary if you have diabetes, high blood pressure, if a family member developed heart diseases at an early age or if you smoke.   Chlamydia, HIV, and other sexual transmitted disease- Get screened each year until the age of 21 then within three months of each new sexual partner.  Diabetes- Have your blood sugar checked regularly if you have high blood pressure, high cholesterol, a family history of diabetes or if you are overweight.  Get these vaccines  Flu shot- Every fall.  Tetanus shot- Every 10 years.  Menactra- Single dose; prevents meningitis.  Take these steps  Don't smoke- If you do smoke, ask your healthcare provider about quitting. For tips on how to quit, go to www.smokefree.gov or call 1-800-QUIT-NOW.  Be physically active- Exercise 5 days a week for at least 30 minutes.  If you are not already physically active start slow and gradually work up to 30 minutes of moderate physical activity.  Examples of moderate activity include walking briskly, mowing the yard, dancing, swimming bicycling, etc.  Eat a healthy diet- Eat a variety of healthy foods such as fruits, vegetables, low fat milk, low fat cheese, yogurt, lean meats, poultry, fish, beans, tofu, etc.  For more information on healthy eating, go to www.thenutritionsource.org  Drink alcohol in moderation- Limit alcohol intake two drinks or less a day.  Never drink and drive.  Dentist- Brush and floss teeth twice daily; visit your dentis twice a year.  Depression-Your emotional  health is as important as your physical health.  If you're feeling down, losing interest in things you normally enjoy please talk with your healthcare provider.  Gun Safety- If you keep a gun in your home, keep it unloaded and with the safety lock on.  Bullets should be stored separately.  Helmet use- Always wear a helmet when riding a motorcycle, bicycle, rollerblading or skateboarding.  Safe sex- If you may be exposed to a sexually transmitted infection, use a condom  Seat belts- Seat bels can save your life; always wear one.  Smoke/Carbon Monoxide detectors- These detectors need to be installed on the appropriate level of your home.  Replace batteries at least once a year.  Skin Cancer- When out in the sun, cover up and use sunscreen SPF 15 or higher.  Violence- If anyone is threatening or hurting you, please tell your healthcare provider.  Immunization Information for Foreign Travel Immunizations can protect you from certain diseases. Immunizations can also prevent the spread of certain infections. It is important to see your caregiver or a travel medicine specialist 4-6 weeks before you travel. This allows time for vaccines to take effect. It also provides enough time for you to get vaccines that must be given in a series over a period of days or weeks. Immunizations for travelers include:  Routine vaccines. These vaccines are standard for the people in a country.  Recommended vaccines. These vaccines are recommended before travel to some countries or regions.  Required vaccines. These vaccines are necessary before travel to specific countries or regions. If it is less  than 4 weeks before you leave, you should still see your caregiver. You might still benefit from vaccines or medicines. WHAT ARE THE ROUTINE VACCINES? Routine vaccines can protect you from diseases that are common in many parts of the world. Most routine vaccines are given at specific ages during your life. However,  routine vaccines also include the annual flu (influenza) vaccine. You should be up-to-date on your routine immunizations before you travel. Your caregiver will be able to review your vaccine history and determine whether you have had all the routine vaccines. You may be advised to get extra doses or booster vaccines even if you are up-to-date on the routine vaccines. WHAT ARE THE RECOMMENDED VACCINES? Know your travel schedule when you visit your caregiver. The vaccines recommended before foreign travel will depend on several factors, including:  The country or countries of travel.  Whether you will travel to rural areas.  The length of time you will be traveling.  The season of the year.  Your age.  Your health status.  Your previous immunizations. Vaccine recommendations change over time. Your caregiver can tell you what vaccines are recommended before your trip. The annual influenza vaccine sometimes differs for the [place] and [place] hemispheres. Unless the annual vaccines are the same in both hemispheres, people with certain chronic medical conditions who are traveling to the other hemisphere shortly before or during the influenza season should also get the other influenza vaccine. The other influenza vaccine should be obtained either before leaving the country or shortly after arrival at the travel site. WHAT ARE THE REQUIRED VACCINES? Vaccines may be required during a current outbreak of an infectious disease in a country or region. Your caregiver will be able to tell you about any current outbreaks and required vaccines. For example, proof of yellow fever immunization is currently required for most people before traveling to certain countries in [place] and [place]. This vaccine can only be obtained at approved centers. You should get the yellow fever vaccine at least 10 days before your trip. After 10 days, most people show immunity to yellow fever. If it has been longer than 10  years since you received the yellow fever vaccine, another dose is required. If proof of immunization is incomplete or inaccurate, you could be quarantined, denied entry, or given another dose of vaccine at the travel site. If you cannot receive the yellow fever vaccine because of medical reasons, you must have a written statement from your caregiver. The statement must contain a medical reason for the lack of immunization. In such a case, your caregiver should then give you advice on how to decrease your chance of getting yellow fever. That advice should include taking precautions to avoid mosquito bites and limiting outdoor time. Other than having a medical condition or being under the age of 6 months, no other reasons will be accepted for not getting the vaccine.  Proof of meningococcal immunization is required by the [place] of Health for any person older than 2 years who is taking part in the [place] or [place]. Visas for traveling to the hajj or [place] will not even be issued until there is proof of immunization. You should get this vaccine at least 10 days before your trip. After 10 days, most people show immunity. If it has been longer than 3 years since your last immunization, another dose is required. FOR MORE INFORMATION  Centers for Disease Control and Prevention (CDC): FootballExhibition.com.br  World Health Organization [facility]): https://castaneda-walker.com/ Document  Released: 01/20/2009 Document Revised: 01/19/2012 Document Reviewed: 12/31/2011 [facility] Patient Information 2015 [place], [place]. This information is not intended to replace advice given to you by your health care provider. Make sure you discuss any questions you have with your health care provider. Viaja fuera de los [place] EL CDC RECOMIENDA LAS SIGUIENTES VACUNAS (SEGN LA EDAD):  Hepatitis A o inmuno globulina (IG).  Hepatitis B, si usted est expuesto al contacto con sangre (por ejemplo, trabajadores de la salud), tiene Pharmacologistcontacto sexual,  Personal assistantpermanecer ms de 6 meses en la regin o est expuesto por un tratamiento mdico.  Rabia, si podra estar expuesto a animales salvajes o domsticos por trabajo o actividad recreativa.  Fiebre tifoidea, particularmente si visita pases en vas de desarrollo.  Vacuna contra la fiebre amarilla, si va a viajar a zonas endmicas.  Si es necesario, una dosis de refuerzo contra el ttano-difteria y Secretary/administratorel sarampin. La vacuna contra la hepatitis B ahora se recomienda a todos los bebs y International Papernios entre 11 y 12 aos que no completaron las series cuando eran bebs. Consulte a su mdico por lo menos 4 a 6 semanas antes de viajar fuera de los [place] para darle tiempo a que las vacunas [address] efecto. PARA MANTENERSE SANO   Lave sus manos frecuentemente con jabn y [place].  Beba slo agua embotellada o hervida, gaseosas en latas o botellas. Evite tomar agua de la canilla, agua de pozo y cubos de hielo. Si esto no es posible, filtre el agua con filtro de "un micrn absoluto o menos" y aada pastillas de yodo al agua filtrada. Los "Universal HealthFiltros de un micrn absoluto" se encuentran en tiendas de camping o especializadas en productos al OGE Energyaire libre.  Coma slo alimentos bien cocidos o frutas y verduras que usted mismo quit la piel. Recuerde: Hervirlo, cocinarlo, pelarlo u olvidarlo.  Si usted va a visitar una zona donde hay riesgo de malaria, tome sus medicamentos preventivos contra la malaria antes, durante y despus del viaje, como se le ha indicado. (Consulte a su mdico para obtener la receta.)  Protjase de las picaduras de mosquitos:  Preste atencin y Psychiatristprotjase de los mosquitos a la cada del sol. En este momento es cuando los mosquitos portadores de malaria estn [place]activos.  Use camisas de manga larga, pantalones largos y sombreros.  Use repelente para insectos que contengan dietilmetiltoluamida.  Lea y siga las instrucciones y precauciones en la etiqueta del producto.  Aplique el repelente a toda la  piel expuesta.  No ponga repelente en la piel herida o daada.  No respire, trague o ponga dietilmetiltoluamida en los ojos. La dietilmetiltoluamida es txico si se ingiere. Si [place]ausa un producto en aerosol, aplique dietilmetiltoluamida en el rostro, rocese las manos y frote con cuidado sobre la cara. Evite frotar los ojos y Government social research officerla boca.  Compre un mosquitero impregnando con permetrina o deltametrina. O roce el mosquitero con uno de esos insecticidas si usted no puede encontrar un mosquitero ya tratado. Esto no es necesario si se aloja con el aire acondicionado o en viviendas bien protegidas.  La dietilmetiltoluamida se puede usar en adultos, nios y bebs de ms de American Financialdos meses de [place] Villageedad. Proteja a los bebs usando un soporte cubierto con un mosquitero con borde elstico para mayor ajuste.  Los nios menores de 10 aos no deben aplicarse repelente de insectos ellos mismos. No aplique en las manos de los nios pequeos o alrededor de los ojos y Government social research officerla boca.  Si visita la zona donde hay malaria, lea las  recomendaciones de prevencin para los inmigrantes recientes en el sitio dedicado a la malaria del CDC.  Para prevenir infecciones por hongos y parasitarias, mantenga los pies limpios y secos. No camine descalzo.  Siempre use condones para reducir Nurse, adult de VIH y otras enfermedades de transmisin sexual. PARA EVITAR ENFERMARSE   No coma alimentos que provengan de vendedores ambulantes.  No tome bebidas con hielo.  No coma productos lcteos a menos que sepa que han sido pasteurizados.  No comparta las agujas con nadie.  No toque a los animales (especialmente monos, perros y gatos). Evite las picaduras y las enfermedades graves (incluyendo la rabia y el peste).  No nade en agua dulce. El agua salada generalmente es ms seguro. QUE NECESITA LLEVAR CON USTED:  Camisas de manga larga, pantalones largos y un sombrero para usar siempre que sea posible, para prevenir las enfermedades transmitidas por  insectos (por ejemplo la malaria, el dengue, la filariasis, leishmaniasis y oncocercosis).  Repelente de insecto que contenga dietilmetiltoluamida.  Mosquiteros impregnados con permetrina. (Se pueden comprar en las tiendas de camping o de suministros militares. De ultramar, insecticidas con permetrina u otros, deltametrina, se pueden comprar para el tratamiento de mosquiteros y ropa.)  Aerosoles para insectos para eliminar los mosquitos de las habitaciones. El producto debe contener un insecticida piretroide, estos insecticidas rpidamente matan a los insectos voladores, como los mosquitos.  Medicamentos antidiarreicos de [employer] para tomar si usted tiene diarrea.  Pastillas de yodo y filtros de agua para purificar el agua si el agua embotellado no est disponible. Ver Qu hacer para ms informacin detallada acerca de los filtros de [place].  Proteccin solar y gafas de sol.  Medicamentos recetados, asegrese de tener suficiente para que duren todo el viaje, as como una copia de la/s receta/s DESPUS DE REGRESAR A SU CASA: Si ha visitado la zona de riesgo de malaria, contine tomando el medicamento contra la malaria durante 4 semanas (cloroquina, doxiciclina o mefloquina) o Landscape architect (atovacuona/proguanil) despus de [place] de la zona de riesgo. La malaria siempre es una enfermedad grave y puede ser mortal. Si usted se enferma con fiebre o alguna enfermedad similar a la gripe ya sea mientras viaja a una zona de riesgo de malaria o despus de regresar a su casa (hasta un ao), debe solicitar atencin mdica inmediata y comentarle al mdico acerca de su viaje. Informacin cortesa del centro del CDC.  Document Released: 05/20/2008 Document Revised: 04/26/2011 [facility] Patient Information 2015 [place], [place]. This information is not intended to replace advice given to you by your health care provider. Make sure you discuss any questions you have with your health care provider.

## 2014-02-28 ENCOUNTER — Encounter (HOSPITAL_COMMUNITY): Payer: Self-pay | Admitting: Otolaryngology

## 2014-10-28 IMAGING — CT CT CERVICAL SPINE W/O CM
4 of 7 series · 12 of 33 positions shown, 14 images · non-contrast
Comparison: None

CT HEAD

CLINICAL DATA: Fell, laceration

CT HEAD WITHOUT CONTRAST
CT MAXILLOFACIAL WITHOUT CONTRAST
CT CERVICAL SPINE WITHOUT CONTRAST
TECHNIQUE: Multidetector CT imaging of the head, cervical spine,
and maxillofacial structures were performed using the standard
protocol without intravenous contrast. Multiplanar CT image
reconstructions of the cervical spine and maxillofacial structures
were also generated.

[Series 5: c-spine st · axial · 0.29mm/px · z∈[-234,-172]mm · 2 of 93 slices shown, 3 images]
[im 31/93  soft-tissue]
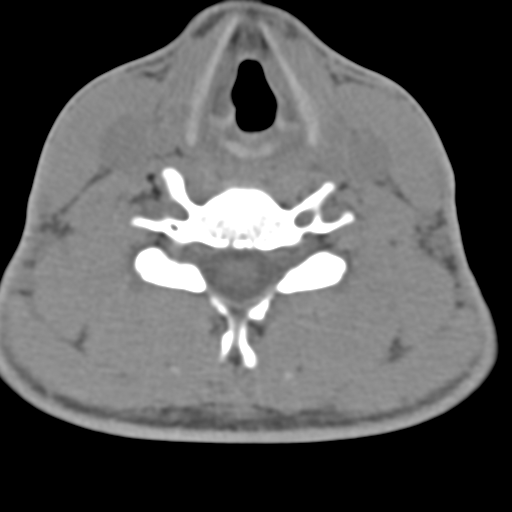
[im 31/93  bone]
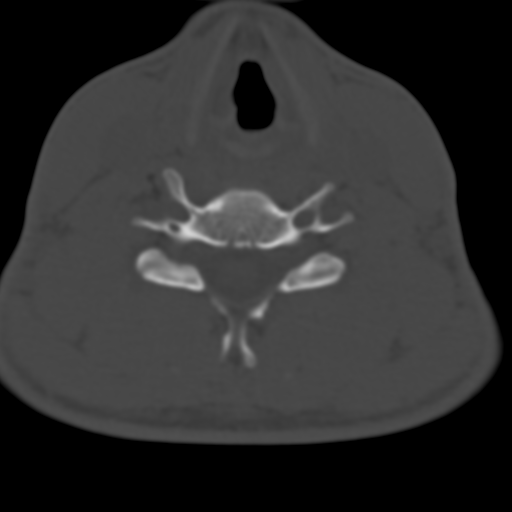
[im 62/93  bone]
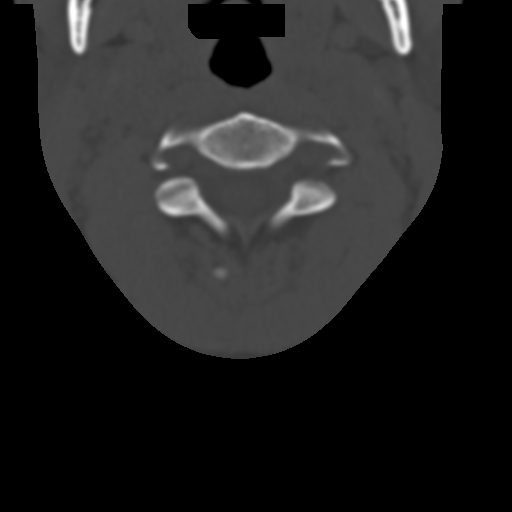

[Series 602: <mpr thick range> · coronal · 0.36mm/px · 3 of 50 slices shown]
[im 10/50  bone]
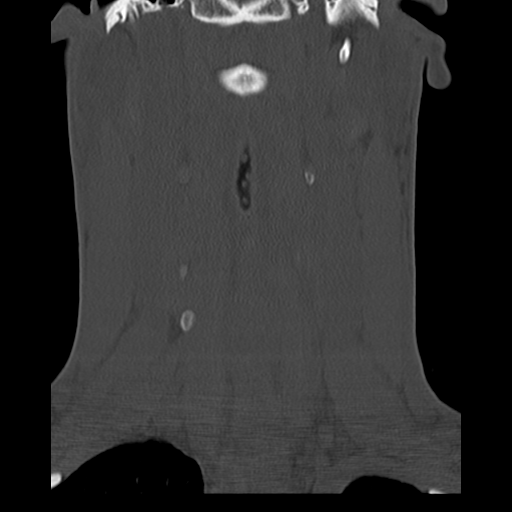
[im 20/50  bone]
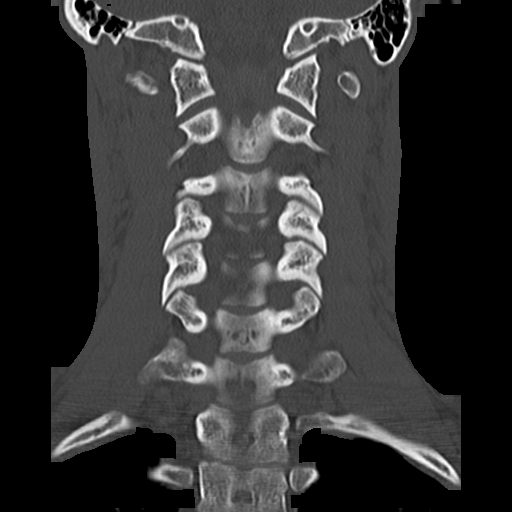
[im 30/50  bone]
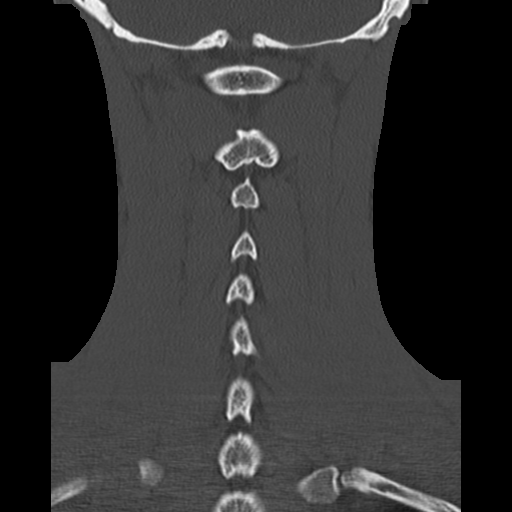

[Series 603: <mpr thick range(1)> · axial · 0.36mm/px · z∈[-260,-201]mm · 2 of 95 slices shown]
[im 32/95  bone]
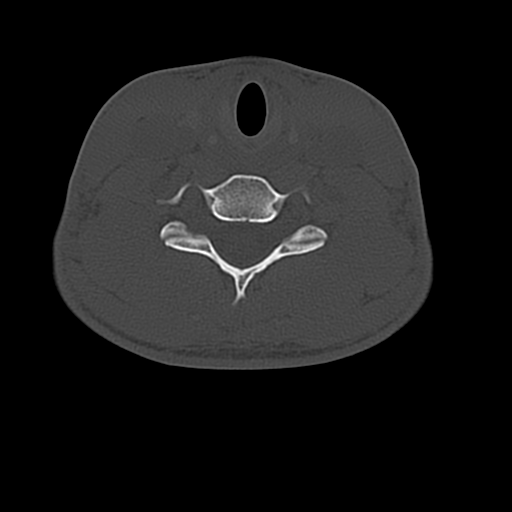
[im 63/95  bone]
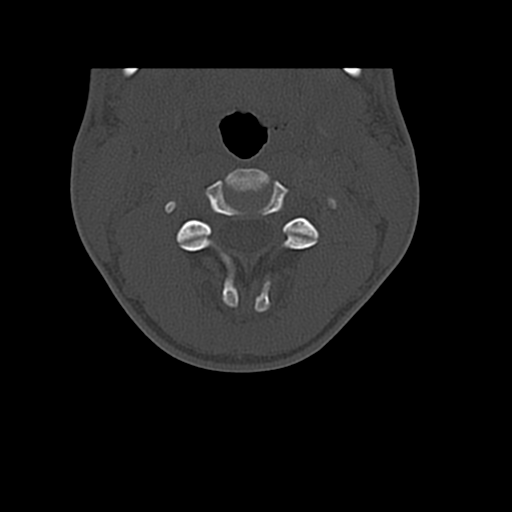

[Series 604: <mpr thick range(2)> · sagittal · 0.36mm/px · 5 of 52 slices shown, 6 images]
[im 18/52  bone]
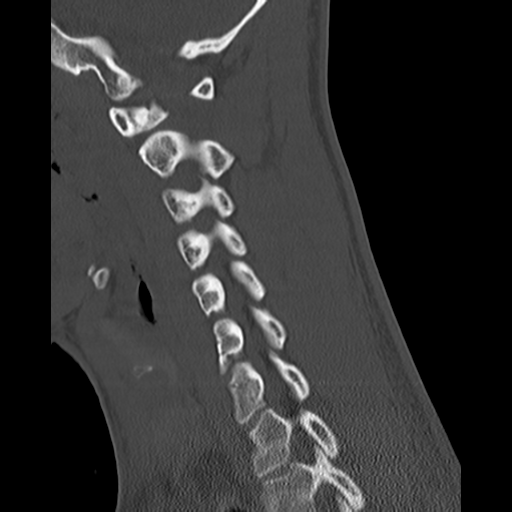
[im 22/52  bone]
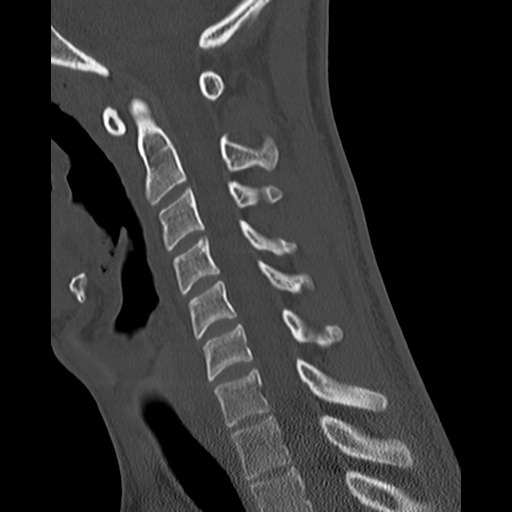
[im 26/52  soft-tissue]
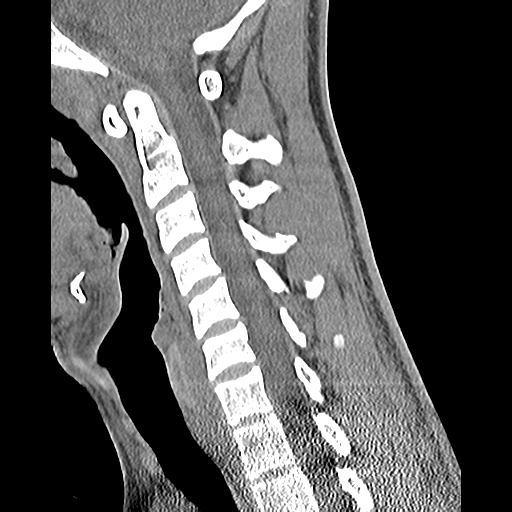
[im 26/52  bone]
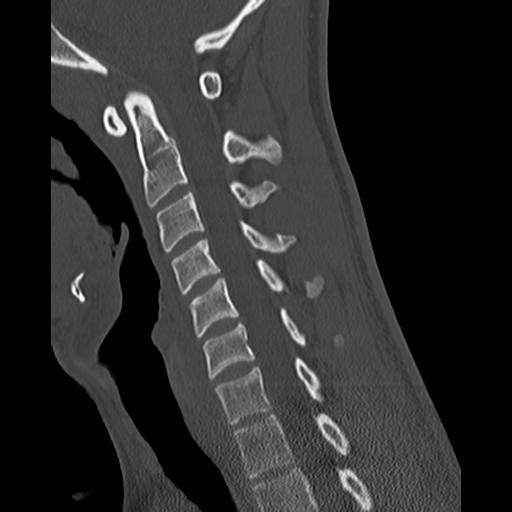
[im 30/52  bone]
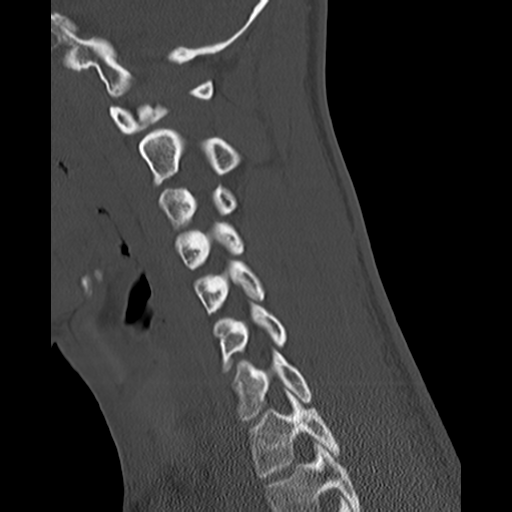
[im 35/52  bone]
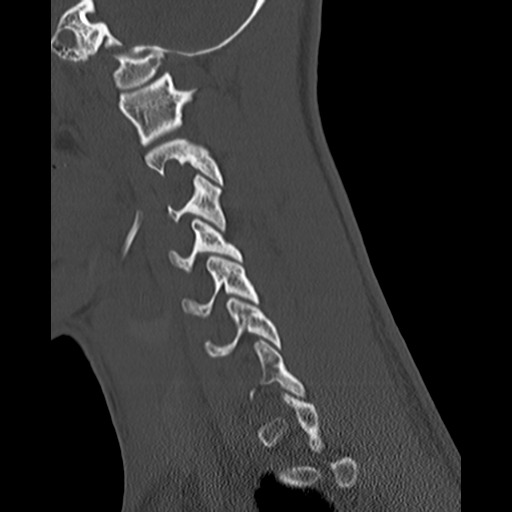

[12 of 33 positions shown; findings below may reference images not displayed]

FINDINGS: There is no evidence of acute intracranial hemorrhage,
brain edema, mass lesion, acute infarction,   mass effect, or
midline shift. Acute infarct may be inapparent on noncontrast CT.
No other intra-axial abnormalities are seen, and the ventricles and
sulci are within normal limits in size and symmetry.   No abnormal
extra-axial fluid collections or masses are identified.  No
significant calvarial abnormality.
IMPRESSION: 1. Negative for bleed or other acute intracranial process.

CT MAXILLOFACIAL
FINDINGS: Paranasal sinuses are normally developed and well
aerated.  Nasal septum midline.  Orbits and globes intact.

There is a fracture through the mandibular symphysis, extending to
involve tooth number 24.  There are bilateral mandibular ramus
fractures, with significant angulation on the right and greater
than shaft with displacement and override on the left.  There is
anterior subluxation of the right temporomandibular joint.
IMPRESSION: 1.  Fractures of the bilateral mandibular ramus and symphysis as
detailed above.

CT CERVICAL SPINE
FINDINGS: Normal alignment.  Vertebral body and intervertebral
disc height maintained throughout.  Facets seated.  No prevertebral
soft tissue swelling.  Negative for fracture.  No significant
osseous degenerative change.
IMPRESSION: 1.  Negative

## 2017-11-14 ENCOUNTER — Telehealth: Payer: Self-pay | Admitting: Family Medicine

## 2017-11-14 NOTE — Telephone Encounter (Signed)
Copied from CRM [id number]. Topic: Quick Communication - See Telephone Encounter >> Nov 14, 2017  5:09 PM Jens Som A wrote: CRM for notification. See Telephone encounter for: 11/14/17. Patient has been accepted to the [facility] and is requesting his immunization record at this time. Patient is requesting immunization record to be emailed to him [email or web address] advise [phone]

## 2017-11-15 ENCOUNTER — Telehealth: Payer: Self-pay | Admitting: Family Medicine

## 2017-11-15 NOTE — Telephone Encounter (Signed)
Please call patient

## 2017-11-16 NOTE — Telephone Encounter (Signed)
I called pt at [place], [place] [phone] and this number has been disconnected.
